# Patient Record
Sex: Female | Born: 1990
Health system: Southern US, Community
[De-identification: ages and names within clinical notes are randomized; demographics above are authoritative.]

## PROBLEM LIST (undated history)

## (undated) DIAGNOSIS — G43909 Migraine, unspecified, not intractable, without status migrainosus: Secondary | ICD-10-CM

---
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
## 2010-03-18 ENCOUNTER — Encounter
Admission: RE | Admit: 2010-03-18 | Discharge: 2010-03-18 | Payer: Self-pay | Source: Home / Self Care | Attending: Orthopedic Surgery | Admitting: Orthopedic Surgery

## 2012-08-23 ENCOUNTER — Ambulatory Visit (INDEPENDENT_AMBULATORY_CARE_PROVIDER_SITE_OTHER): Payer: BC Managed Care – PPO | Admitting: Family Medicine

## 2012-08-23 ENCOUNTER — Ambulatory Visit: Payer: BC Managed Care – PPO

## 2012-08-23 VITALS — BP 142/72 | HR 88 | Temp 98.0°F | Resp 16 | Ht 67.0 in | Wt 243.0 lb

## 2012-08-23 DIAGNOSIS — R079 Chest pain, unspecified: Secondary | ICD-10-CM

## 2012-08-23 DIAGNOSIS — R209 Unspecified disturbances of skin sensation: Secondary | ICD-10-CM

## 2012-08-23 DIAGNOSIS — IMO0001 Reserved for inherently not codable concepts without codable children: Secondary | ICD-10-CM

## 2012-08-23 DIAGNOSIS — R000 Tachycardia, unspecified: Secondary | ICD-10-CM

## 2012-08-23 DIAGNOSIS — R202 Paresthesia of skin: Secondary | ICD-10-CM

## 2012-08-23 DIAGNOSIS — R030 Elevated blood-pressure reading, without diagnosis of hypertension: Secondary | ICD-10-CM

## 2012-08-23 LAB — POCT CBC
Granulocyte percent: 77.3 %G (ref 37–80)
Hemoglobin: 14 g/dL (ref 12.2–16.2)
Lymph, poc: 2 (ref 0.6–3.4)
MCH, POC: 30.2 pg (ref 27–31.2)
MCHC: 31.9 g/dL (ref 31.8–35.4)
MCV: 94.9 fL (ref 80–97)
MID (cbc): 0.5 (ref 0–0.9)
MPV: 10.2 fL (ref 0–99.8)
POC Granulocyte: 8.4 — AB (ref 2–6.9)
POC LYMPH PERCENT: 18.4 %L (ref 10–50)
POC MID %: 4.3 %M (ref 0–12)
Platelet Count, POC: 313 10*3/uL (ref 142–424)
RBC: 4.63 M/uL (ref 4.04–5.48)
WBC: 10.9 10*3/uL — AB (ref 4.6–10.2)

## 2012-08-23 LAB — COMPREHENSIVE METABOLIC PANEL
Albumin: 4.3 g/dL (ref 3.5–5.2)
BUN: 16 mg/dL (ref 6–23)
CO2: 27 mEq/L (ref 19–32)
Chloride: 104 mEq/L (ref 96–112)
Creat: 0.85 mg/dL (ref 0.50–1.10)
Glucose, Bld: 108 mg/dL — ABNORMAL HIGH (ref 70–99)
Potassium: 4.2 mEq/L (ref 3.5–5.3)
Sodium: 140 mEq/L (ref 135–145)
Total Bilirubin: 0.5 mg/dL (ref 0.3–1.2)
Total Protein: 7 g/dL (ref 6.0–8.3)

## 2012-08-23 NOTE — Patient Instructions (Addendum)
If experiencing shoulder pain, take Tylenol. If Tylenol is not effective take the Voltaren with food to reduce the chance of nausea.  If your symptoms return or worsen - go the ER.

## 2012-08-23 NOTE — Progress Notes (Signed)
  Subjective:    Patient ID: Heather Duke, female    DOB: 14-Aug-1990, 22 y.o.   MRN: [id number]  HPI  Presents with paraesthesia x 1 hour following a "popping" sensation in her chest. Popping sensation is described as non painful in the sternal area which led to numbness/tingling across entire body. Pt reports when the numbness/tingling moved to her throat she decided to come to [facility]. Paraesthesia is bilateral arms, LEFT arm worse, and left face. Reports decreased strength in LEFT arm. Pt fell on Sunday and injured RIGHT rotator cuff. Pt began taking two new meds: tramodol and voltaren - she wonders if these could cause her symptoms. Reports these are causing nausea prior to today.     Review of Systems Denies: SOB, chest pain, abdominal pain, urinary symptoms.     Objective:   Physical Exam  Repeat vitals show pulse 88, BP 142/74   General: WDWN female, appears stated age, appears tearful and mildly nervous  Resp: clear to auscultation anterior and posterior fields, no rales/rhonchi/wheezes Cardiac: RRR, no murmurs/rubs/gallops Neuro: alert & oriented x 3, cranial nerves II-XII intact, cerebellar intact, decreased sensation in left arm from hand to elbow, decrease left grip strength, reflexes are 2+ patellar achilles biceps and brachioradialis bilaterally  Skin: no rashes, no lesions.   Results for orders placed in visit on 08/23/12  POCT CBC      Result Value Range   WBC 10.9 (*) 4.6 - 10.2 K/uL   Lymph, poc 2.0  0.6 - 3.4   POC LYMPH PERCENT 18.4  10 - 50 %L   MID (cbc) 0.5  0 - 0.9   POC MID % 4.3  0 - 12 %M   POC Granulocyte 8.4 (*) 2 - 6.9   Granulocyte percent 77.3  37 - 80 %G   RBC 4.63  4.04 - 5.48 M/uL   Hemoglobin 14.0  12.2 - 16.2 g/dL   HCT, POC 40.9  81.1 - 47.9 %   MCV 94.9  80 - 97 fL   MCH, POC 30.2  27 - 31.2 pg   MCHC 31.9  31.8 - 35.4 g/dL   RDW, POC 91.4     Platelet Count, POC 313  142 - 424 K/uL   MPV 10.2  0 - 99.8 fL  GLUCOSE, POCT (MANUAL  RESULT ENTRY)      Result Value Range   POC Glucose 108 (*) 70 - 99 mg/dl    Chest Xray read by Dr. Alwyn Ren: PA view shows some cardiomegaly however patient is twisted on xray providing illusion of cardiomegaly. No infiltrates.  No pneumothorax.     Assessment & Plan:   Chest pain - Plan: EKG 12-Lead, Comprehensive metabolic panel, DG Chest 2 View  Paresthesias - Plan: POCT CBC, POCT glucose (manual entry)  Elevated BP - Plan: TSH  Tachycardia  Repeat vitals showed significant improvement. Suspect anxiety as etiology for paraesthesia. Less likely to be side effect of medication, but since medication is the likely cause of nausea will encourage pt to quit taking medication. If she experiences pain she should take one medication or the other (instead of both) and take medication with food.

## 2012-08-23 NOTE — Progress Notes (Signed)
I have examined this patient along with the student and agree.  Home BP 110's systolic.  Typically 140's in medical offices. Never as high as it was initially today, which also worried her.  Reassured with improved pressure on repeat.  Etiology of symptoms is unclear.  There does not appear to be a cardiac cause.  Cerebrovascular event very unlikely. She may have experienced some reflux, due to the NSAID, and then symptoms worsened with anxiety. Anticipatory guidance provided.  To go the the ED if symptoms worsen/persist.  Discussed with Dr. Alwyn Ren.

## 2012-08-24 NOTE — Progress Notes (Signed)
Discussed patient, symptoms, tests, and plans with PA student and with Theora Gianotti [facility] and agree with assessment and approach.

## 2012-10-25 ENCOUNTER — Ambulatory Visit: Payer: BC Managed Care – PPO | Admitting: Family Medicine

## 2013-01-18 ENCOUNTER — Other Ambulatory Visit: Payer: Self-pay

## 2016-08-02 ENCOUNTER — Encounter (HOSPITAL_COMMUNITY): Payer: Self-pay

## 2016-08-02 ENCOUNTER — Emergency Department (HOSPITAL_COMMUNITY): Payer: BLUE CROSS/BLUE SHIELD

## 2016-08-02 ENCOUNTER — Emergency Department (HOSPITAL_COMMUNITY)
Admission: EM | Admit: 2016-08-02 | Discharge: 2016-08-03 | Disposition: A | Discharge status: Home / Self Care | Payer: BLUE CROSS/BLUE SHIELD | Attending: Emergency Medicine | Admitting: Emergency Medicine

## 2016-08-02 DIAGNOSIS — R200 Anesthesia of skin: Secondary | ICD-10-CM | POA: Diagnosis not present

## 2016-08-02 DIAGNOSIS — Z79899 Other long term (current) drug therapy: Secondary | ICD-10-CM | POA: Insufficient documentation

## 2016-08-02 DIAGNOSIS — G43109 Migraine with aura, not intractable, without status migrainosus: Secondary | ICD-10-CM | POA: Insufficient documentation

## 2016-08-02 DIAGNOSIS — Z5181 Encounter for therapeutic drug level monitoring: Secondary | ICD-10-CM | POA: Insufficient documentation

## 2016-08-02 DIAGNOSIS — R202 Paresthesia of skin: Secondary | ICD-10-CM | POA: Diagnosis not present

## 2016-08-02 LAB — COMPREHENSIVE METABOLIC PANEL
ALBUMIN: 3.7 g/dL (ref 3.5–5.0)
ALT: 22 U/L (ref 14–54)
AST: 20 U/L (ref 15–41)
Alkaline Phosphatase: 78 U/L (ref 38–126)
Anion gap: 9 (ref 5–15)
BILIRUBIN TOTAL: 0.3 mg/dL (ref 0.3–1.2)
BUN: 10 mg/dL (ref 6–20)
CO2: 22 mmol/L (ref 22–32)
Calcium: 9.5 mg/dL (ref 8.9–10.3)
Chloride: 107 mmol/L (ref 101–111)
Creatinine, Ser: 0.9 mg/dL (ref 0.44–1.00)
GFR calc Af Amer: >60 mL/min (ref >60)
GFR calc non Af Amer: >60 mL/min (ref >60)
Glucose, Bld: 120 mg/dL — ABNORMAL HIGH (ref 65–99)
Potassium: 3.9 mmol/L (ref 3.5–5.1)
Sodium: 138 mmol/L (ref 135–145)
Total Protein: 7 g/dL (ref 6.5–8.1)

## 2016-08-02 LAB — I-STAT TROPONIN, ED: TROPONIN I, POC: 0 ng/mL (ref 0.00–0.08)

## 2016-08-02 LAB — I-STAT CHEM 8, ED
BUN: 11 mg/dL (ref 6–20)
Calcium, Ion: 1.19 mmol/L (ref 1.15–1.40)
Chloride: 107 mmol/L (ref 101–111)
Creatinine, Ser: 0.9 mg/dL (ref 0.44–1.00)
Glucose, Bld: 123 mg/dL — ABNORMAL HIGH (ref 65–99)
HCT: 43 % (ref 36.0–46.0)
Hemoglobin: 14.6 g/dL (ref 12.0–15.0)
Potassium: 4 mmol/L (ref 3.5–5.1)
Sodium: 140 mmol/L (ref 135–145)
TCO2: 22 mmol/L (ref 0–100)

## 2016-08-02 LAB — CBC
HEMATOCRIT: 41.7 % (ref 36.0–46.0)
Hemoglobin: 13.7 g/dL (ref 12.0–15.0)
MCH: 28.4 pg (ref 26.0–34.0)
MCHC: 32.9 g/dL (ref 30.0–36.0)
MCV: 86.3 fL (ref 78.0–100.0)
Platelets: 375 10*3/uL (ref 150–400)
RBC: 4.83 MIL/uL (ref 3.87–5.11)
RDW: 13.9 % (ref 11.5–15.5)
WBC: 12.8 10*3/uL — ABNORMAL HIGH (ref 4.0–10.5)

## 2016-08-02 LAB — DIFFERENTIAL
Basophils Absolute: 0 10*3/uL (ref 0.0–0.1)
Basophils Relative: 0 %
Eosinophils Absolute: 0.1 10*3/uL (ref 0.0–0.7)
Eosinophils Relative: 1 %
Lymphocytes Relative: 19 %
Lymphs Abs: 2.4 10*3/uL (ref 0.7–4.0)
MONOS PCT: 4 %
Monocytes Absolute: 0.5 10*3/uL (ref 0.1–1.0)
Neutro Abs: 9.8 10*3/uL — ABNORMAL HIGH (ref 1.7–7.7)
Neutrophils Relative %: 76 %

## 2016-08-02 LAB — PROTIME-INR
INR: 0.96
Prothrombin Time: 12.8 seconds (ref 11.4–15.2)

## 2016-08-02 LAB — APTT: aPTT: 29 seconds (ref 24–36)

## 2016-08-02 MED ORDER — DEXAMETHASONE SODIUM PHOSPHATE 10 MG/ML IJ SOLN
10.0000 mg | Freq: Once | INTRAMUSCULAR | Status: AC
  Administered 2016-08-02: 10 mg via INTRAVENOUS
  Filled 2016-08-02: qty 1

## 2016-08-02 MED ORDER — SODIUM CHLORIDE 0.9 % IV BOLUS (SEPSIS)
1000.0000 mL | Freq: Once | INTRAVENOUS | Status: AC
  Administered 2016-08-02: 1000 mL via INTRAVENOUS

## 2016-08-02 MED ORDER — PROCHLORPERAZINE EDISYLATE 5 MG/ML IJ SOLN
10.0000 mg | Freq: Once | INTRAMUSCULAR | Status: AC
  Administered 2016-08-02: 10 mg via INTRAVENOUS
  Filled 2016-08-02: qty 2

## 2016-08-02 MED ORDER — DIPHENHYDRAMINE HCL 50 MG/ML IJ SOLN
25.0000 mg | Freq: Once | INTRAMUSCULAR | Status: AC
  Administered 2016-08-02: 25 mg via INTRAVENOUS
  Filled 2016-08-02: qty 1

## 2016-08-02 NOTE — ED Notes (Signed)
Patient stated that headache was only there momentarily.  No headache since arriving to the hospital.  No medicine taken by patient prior to coming to the hospital.

## 2016-08-02 NOTE — ED Notes (Signed)
Patient states that the pain and numb feeling comes and goes.  Not having any new pain or numbness at this time.  MD aware to come and revaluate.

## 2016-08-02 NOTE — ED Triage Notes (Signed)
Patient here for numbness on the left side with a headache and left sided facial droop.  Symptoms began at 1400, ED physican activated code stroke.  Neurologist at bedside in CT for exam. No indications for TPA per Neurologist at this time.  Patient is A&Ox4

## 2016-08-02 NOTE — ED Notes (Signed)
Patient ambulated to the restroom. No distress noted at this time. Patient and family aware still waiting re evaluation.

## 2016-08-02 NOTE — Consult Note (Signed)
Neurology Consultation Reason for Consult: Facial numbness Referring Physician: Tegeler, C  CC: Facial numbness  History is obtained from: Patient  HPI: Heather Duke is a 26 y.o. female with a history of headaches described as unilateral, retro-orbital, throbbing associated with photophobia who presents with left-sided facial numbness and tingling.  She states that initially her entire face felt numb, but then the tingling moved to her left side of her face and down into her arm. There has been persistent and therefore she sought care in the emergency department. Her forehead, but mostly the lower face and upper arm.   LKW: 2 PM tpa given?: no, mild symptoms  ROS: A 14 point ROS was performed and is negative except as noted in the HPI.   Past medical history: Headaches(description consistent with migraines)   Family History  Problem Relation Age of Onset  . Heart disease Paternal Grandfather      Social History:  reports that she has never smoked. She has never used smokeless tobacco. She reports that she drinks alcohol. She reports that she does not use drugs.   Exam: Current vital signs: BP (!) 142/90 (BP Location: Left Arm)   Pulse (!) 114   Temp 98.2 F (36.8 C) (Oral)   Resp 18   Ht 5\' 7"  (1.702 m)   Wt 127.3 kg (280 lb 9 oz)   SpO2 99%   BMI 43.94 kg/m  Vital signs in last 24 hours: Temp:  [98.2 F (36.8 C)-98.7 F (37.1 C)] 98.2 F (36.8 C) (05/21 1710) Pulse Rate:  [114-126] 114 (05/21 1710) Resp:  [18-20] 18 (05/21 1710) BP: (142-160)/(90-100) 142/90 (05/21 1710) SpO2:  [99 %] 99 % (05/21 1710) Weight:  [127.3 kg (280 lb 9 oz)] 127.3 kg (280 lb 9 oz) (05/21 1711)   Physical Exam  Constitutional: Appears well-developed and well-nourished.  Psych: Affect appropriate to situation Eyes: No scleral injection HENT: No OP obstrucion Head: Normocephalic.  Cardiovascular: Normal rate and regular rhythm.  Respiratory: Effort normal and breath sounds  normal to anterior ascultation GI: Soft.  No distension. There is no tenderness.  Skin: WDI  Neuro: Mental Status: Patient is awake, alert, oriented to person, place, month, year, and situation. Patient is able to give a clear and coherent history. No signs of aphasia or neglect Cranial Nerves: II: Visual Fields are full. Pupils are equal, round, and reactive to light.   III,IV, VI: EOMI without ptosis or diploplia.  V: Facial sensation is Decreased in the left lower face VII: Facial movement is notable for decreased movement of the left lips, when asked to puff out her cheeks, she holds her left face taut  VIII: hearing is intact to voice X: Uvula elevates symmetrically XI: Shoulder shrug is symmetric. XII: tongue is midline without atrophy or fasciculations.  Motor: Tone is normal. Bulk is normal. 5/5 strength was present in all four extremities.  Sensory: Sensation is symmetric to light touch and temperature in the arms and legs. Deep Tendon Reflexes: 2+ and symmetric in the biceps and patellae.  Cerebellar: FNF and HKS are intact bilaterally   I have reviewed labs in epic and the results pertinent to this consultation are: Chem 8-unremarkable  I have reviewed the images obtained: CT head-unremarkable  Impression: 27 year old female with spreading paresthesia in the setting of frequent migraines. Her facial droop appears to have some component of embellishment. I do wonder about possible persistent migraine aura given the frequent migraine like headaches that she describes over the past  few days.  I would favor treating and if her symptoms improve, then no further evaluation is needed. She does continue to have symptoms, then an MRI would need be needed to exclude organic causes.  Recommendations: 1) migraine cocktail 2) if no improvement with cocktail, would perform MRI and if negative, no further workup would be needed.   Ritta SlotMcNeill , MD [facility] [phone]  If 7pm- 7am, please page neurology on call as listed in AMION.

## 2016-08-02 NOTE — ED Notes (Addendum)
CT negative for acute stroke, with aspects of 10. Plan of care is to give migraine meds and revaluate.  If no improvement then MRI.  Plan of care told to family by ED MD.

## 2016-08-02 NOTE — ED Provider Notes (Signed)
[facility]-EMERGENCY DEPT Provider Note   CSN: [id number] Arrival date & time: 08/02/16  1642     History   Chief Complaint Chief Complaint  Patient presents with  . Numbness    HPI Heather Duke is a 26 y.o. female.  The history is provided by the patient, a parent and medical records. No language interpreter was used.  Neurologic Problem  This is a new problem. The current episode started 3 to 5 hours ago. The problem occurs constantly. The problem has not changed since onset.Associated symptoms include headaches (recent). Pertinent negatives include no chest pain, no abdominal pain and no shortness of breath. Nothing aggravates the symptoms. Nothing relieves the symptoms. She has tried nothing for the symptoms. The treatment provided no relief.    History reviewed. No pertinent past medical history.  There are no active problems to display for this patient.   History reviewed. No pertinent surgical history.  OB History    No data available       Home Medications    Prior to Admission medications   Medication Sig Start Date End Date Taking? Authorizing Provider  diclofenac (VOLTAREN) 75 MG EC tablet Take 75 mg by mouth 2 (two) times daily.    [provider]  traMADol (ULTRAM) 50 MG tablet Take 50 mg by mouth every 6 (six) hours as needed for pain.    [provider]    Family History Family History  Problem Relation Age of Onset  . Heart disease Paternal Grandfather     Social History Social History  Substance Use Topics  . Smoking status: Never Smoker  . Smokeless tobacco: Never Used  . Alcohol use Yes     Allergies   Singulair [montelukast sodium]   Review of Systems Review of Systems  Constitutional: Negative for activity change, appetite change, chills, diaphoresis, fatigue and fever.  HENT: Negative for congestion, facial swelling and rhinorrhea.   Eyes: Negative for visual disturbance.  Respiratory: Negative for cough,  chest tightness, shortness of breath and stridor.   Cardiovascular: Negative for chest pain, palpitations and leg swelling.  Gastrointestinal: Negative for abdominal distention, abdominal pain, constipation, diarrhea, nausea and vomiting.  Genitourinary: Negative for difficulty urinating, dysuria, flank pain, frequency, hematuria, menstrual problem, pelvic pain, vaginal bleeding and vaginal discharge.  Musculoskeletal: Negative for back pain and neck pain.  Skin: Negative for rash and wound.  Neurological: Positive for numbness and headaches (recent). Negative for dizziness, tremors, seizures, facial asymmetry, speech difficulty, weakness and light-headedness.  Psychiatric/Behavioral: Negative for agitation and confusion.  All other systems reviewed and are negative.    Physical Exam Updated Vital Signs BP (!) 142/90 (BP Location: Left Arm)   Pulse (!) 114   Temp 98.2 F (36.8 C) (Oral)   Resp 18   Ht 5\' 7"  (1.702 m)   Wt 127.3 kg (280 lb 9 oz)   SpO2 99%   BMI 43.94 kg/m   Physical Exam  Constitutional: She is oriented to person, place, and time. She appears well-developed and well-nourished. No distress.  HENT:  Head: Normocephalic and atraumatic.  Mouth/Throat: Oropharynx is clear and moist. No oropharyngeal exudate.  Eyes: Conjunctivae and EOM are normal. Pupils are equal, round, and reactive to light.  Neck: Normal range of motion. Neck supple.  Cardiovascular: Normal rate, regular rhythm and intact distal pulses.  Exam reveals no gallop.   No murmur heard. Pulmonary/Chest: Effort normal and breath sounds normal. No stridor. No respiratory distress. She exhibits no tenderness.  Abdominal:  Soft. There is no tenderness.  Musculoskeletal: She exhibits no edema.  Neurological: She is alert and oriented to person, place, and time. She displays no tremor and normal reflexes. A sensory deficit is present. No cranial nerve deficit. She exhibits normal muscle tone. She displays no  seizure activity. Coordination normal. GCS eye subscore is 4. GCS verbal subscore is 5. GCS motor subscore is 6.  Left facial numbness and left arm numbness. Mild. No facial droop. Normal finger-nose-finger. Normal grip strength. Exam otherwise unremarkable.  Skin: Skin is warm and dry. Capillary refill takes less than 2 seconds. No rash noted. No erythema.  Psychiatric: She has a normal mood and affect.  Nursing note and vitals reviewed.    ED Treatments / Results  Labs (all labs ordered are listed, but only abnormal results are displayed) Labs Reviewed  CBC - Abnormal; Notable for the following:       Result Value   WBC 12.8 (*)    All other components within normal limits  DIFFERENTIAL - Abnormal; Notable for the following:    Neutro Abs 9.8 (*)    All other components within normal limits  COMPREHENSIVE METABOLIC PANEL - Abnormal; Notable for the following:    Glucose, Bld 120 (*)    All other components within normal limits  I-STAT CHEM 8, ED - Abnormal; Notable for the following:    Glucose, Bld 123 (*)    All other components within normal limits  PROTIME-INR  APTT  I-STAT TROPOININ, ED  CBG MONITORING, ED    EKG  EKG Interpretation  Date/Time:  Monday Aug 02 2016 16:51:53 EDT Ventricular Rate:  125 PR Interval:  112 QRS Duration: 82 QT Interval:  326 QTC Calculation: 470 R Axis:   82 Text Interpretation:  Sinus tachycardia Otherwise normal ECG No prior ECG for comparison.  No STEMI Confirmed by Theda Belfast ([id number]) on 08/02/2016 8:25:39 PM       Radiology Ct Head Code Stroke W/o Cm  Result Date: 08/02/2016 CLINICAL DATA:  Code stroke. LEFT-sided weakness. Lack of coordination. EXAM: CT HEAD WITHOUT CONTRAST TECHNIQUE: Contiguous axial images were obtained from the base of the skull through the vertex without intravenous contrast. COMPARISON:  None. FINDINGS: Brain: No evidence for acute infarction, hemorrhage, mass lesion, hydrocephalus, or extra-axial fluid.  Normal cerebral volume. No white matter disease. Vascular: No hyperdense vessel or unexpected calcification. Skull: Normal. Negative for fracture or focal lesion. Sinuses/Orbits: No acute finding. Other: None. ASPECTS [facility] Stroke Program Early CT Score) - Ganglionic level infarction (caudate, lentiform nuclei, internal capsule, insula, M1-M3 cortex): 7 - Supraganglionic infarction (M4-M6 cortex): 3 Total score (0-10 with 10 being normal): 10 IMPRESSION: 1. Negative exam 2. ASPECTS is 10 These results were called by telephone at the time of interpretation on 08/02/2016 at 5:49 pm to Dr. Amada Jupiter , who verbally acknowledged these results. Electronically Signed   By: Elsie Stain M.D.   On: 08/02/2016 17:49    Procedures Procedures (including critical care time)  Medications Ordered in ED Medications  sodium chloride 0.9 % bolus 1,000 mL (0 mLs Intravenous Stopped 08/02/16 1921)  prochlorperazine (COMPAZINE) injection 10 mg (10 mg Intravenous Given 08/02/16 1814)  dexamethasone (DECADRON) injection 10 mg (10 mg Intravenous Given 08/02/16 1826)  diphenhydrAMINE (BENADRYL) injection 25 mg (25 mg Intravenous Given 08/02/16 1815)     Initial Impression / Assessment and Plan / ED Course  I have reviewed the triage vital signs and the nursing notes.  Pertinent labs & imaging results that  were available during my care of the patient were reviewed by me and considered in my medical decision making (see chart for details).     VERNON ARIEL is a 26 y.o. female with a past medical history of headaches who presents with left-sided face and left arm numbness. Patient was made a code stroke in route for her symptoms. Patient's last normal was approximately 3 hours prior to arrival. Patient denied any weakness or facial droop. No difficulty speaking. No other complaints. Neurology saw the patient shortly after arrival and after CT scan.  Patient was examined by me. Patient had clear lungs. Nontender  chest. Nontender abdomen. No focal weakness or facial droop seen. Tingling sensation in left face and left arm. Normal pulses. No rashes. No tick exposures.  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 Neurology recommended administration of headache cocktail and reassessment. If patient had no improvement in symptoms, would consider MRI however is improved, likely migrainous cause.  Patient was given migraine cocktail of fluids, Decadron, Compazine, and Benadryl. Patient reported near complete resolution in her symptoms after administration of medications and observation.  Patient continued to have resolution of symptoms on my reassessment. Patient continued to have no weakness or facial droop.  Given improvement in symptoms, suspect the non-headache aura from atypical migraine. Patient given prescription for Compazine and instructions to follow-up with neurology. Do not feel patient needs MRI and family and patient all agree. Patient understood return precautions for any new or worsened symptoms. Patient had no depressions or concerns and patient was discharged in good condition.   Final Clinical Impressions(s) / ED Diagnoses   Final diagnoses:  Migraine aura without headache  Numbness  Paresthesias    New Prescriptions Discharge Medication List as of 08/03/2016 12:12 AM    START taking these medications   Details  prochlorperazine (COMPAZINE) 10 MG tablet Take 1 tablet (10 mg total) by mouth 2 (two) times daily as needed for nausea or vomiting (and headache)., Starting Tue 08/03/2016, Print        Clinical Impression: 1. Migraine aura without headache   2. Numbness   3. Paresthesias     Disposition: Discharge  Condition: Good  I have discussed the results, Dx and Tx plan with the pt(& family if present). He/she/they expressed understanding and agree(s) with the plan. Discharge instructions  discussed at great length. Strict return precautions discussed and pt &/or family have verbalized understanding of the instructions. No further questions at time of discharge.    Discharge Medication List as of 08/03/2016 12:12 AM    START taking these medications   Details  prochlorperazine (COMPAZINE) 10 MG tablet Take 1 tablet (10 mg total) by mouth 2 (two) times daily as needed for nausea or vomiting (and headache)., Starting Tue 08/03/2016, Print        Follow Up: [facility] [address]     [address] [place] [address] [phone] Schedule an appointment as soon as possible for a visit    [facility] EMERGENCY DEPARTMENT [address] [phone] mc [place] [address] [phone]  If symptoms worsen     Tegeler, Canary Brim, MD 08/03/16 0201

## 2016-08-02 NOTE — ED Notes (Signed)
ED physician at bedside.

## 2016-08-02 NOTE — ED Notes (Signed)
Pt ambulated to restroom, tolerated well.

## 2016-08-03 MED ORDER — PROCHLORPERAZINE MALEATE 10 MG PO TABS: 10.0000 mg | ORAL_TABLET | Freq: Two times a day (BID) | ORAL | 0 refills | Status: AC | PRN

## 2016-08-03 NOTE — Discharge Instructions (Signed)
Your workup today did not show acute stroke. 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 You responded well to the medications. Please follow-up with the neurology team. If any symptoms change or worsen, please return to the nearest emergency department.

## 2016-08-03 NOTE — ED Notes (Signed)
Patient Alert and oriented X4. Stable and ambulatory. Patient verbalized understanding of the discharge instructions.  Patient belongings were taken by the patient.  

## 2016-08-30 ENCOUNTER — Ambulatory Visit (INDEPENDENT_AMBULATORY_CARE_PROVIDER_SITE_OTHER): Payer: BLUE CROSS/BLUE SHIELD | Admitting: Neurology

## 2016-08-30 ENCOUNTER — Encounter: Payer: Self-pay | Admitting: Neurology

## 2016-08-30 VITALS — BP 158/94 | HR 117 | Ht 66.0 in | Wt 282.0 lb

## 2016-08-30 DIAGNOSIS — G43109 Migraine with aura, not intractable, without status migrainosus: Secondary | ICD-10-CM | POA: Diagnosis not present

## 2016-08-30 DIAGNOSIS — F515 Nightmare disorder: Secondary | ICD-10-CM

## 2016-08-30 DIAGNOSIS — E669 Obesity, unspecified: Secondary | ICD-10-CM | POA: Diagnosis not present

## 2016-08-30 DIAGNOSIS — R0683 Snoring: Secondary | ICD-10-CM | POA: Diagnosis not present

## 2016-08-30 DIAGNOSIS — R51 Headache: Secondary | ICD-10-CM | POA: Diagnosis not present

## 2016-08-30 DIAGNOSIS — R519 Headache, unspecified: Secondary | ICD-10-CM

## 2016-08-30 NOTE — Patient Instructions (Signed)
Please remember, common headache triggers are: sleep deprivation, dehydration, overheating, stress, hypoglycemia or skipping meals and blood sugar fluctuations, excessive pain medications or excessive alcohol use or caffeine withdrawal. Some people have food triggers such as aged cheese, orange juice or chocolate, especially dark chocolate, or MSG (monosodium glutamate). Try to avoid these headache triggers as much possible. It may be helpful to keep a headache diary to figure out what makes your headaches worse or brings them on and what alleviates them. Some people report headache onset after exercise but studies have shown that regular exercise may actually prevent headaches from coming. If you have exercise-induced headaches, please make sure that you drink plenty of fluid before and after exercising and that you do not over do it and do not overheat.  We will do a brain scan, called MRI and call you with the test results. We will have to schedule you for this on a separate date. This test requires authorization from your insurance, and we will take care of the insurance process.  Based on your symptoms and your exam I believe you are at risk for obstructive sleep apnea or OSA, and I think we should proceed with a sleep study to determine whether you do or do not have OSA and how severe it is. If you have more than mild OSA, I want you to consider treatment with CPAP. Please remember, the risks and ramifications of moderate to severe obstructive sleep apnea or OSA are: Cardiovascular disease, including congestive heart failure, stroke, difficult to control hypertension, arrhythmias, and even type 2 diabetes has been linked to untreated OSA. Sleep apnea causes disruption of sleep and sleep deprivation in most cases, which, in turn, can cause recurrent headaches, problems with memory, mood, concentration, focus, and vigilance. Most people with untreated sleep apnea report excessive daytime sleepiness, which  can affect their ability to drive. Please do not drive if you feel sleepy.   I will likely see you back after your sleep study to go over the test results and where to go from there. We will call you after your sleep study to advise about the results (most likely, you will hear from Lafonda Mossesiana, my nurse) and to set up an appointment at the time, as necessary.    Our sleep lab administrative assistant, Alvis LemmingsDawn will meet with you or call you to schedule your sleep study. If you don't hear back from her by next week please feel free to call her at [phone]. This is her direct line and please leave a message with your phone number to call back if you get the voicemail box. She will call back as soon as possible.

## 2016-08-30 NOTE — Progress Notes (Signed)
Subjective:    Patient ID: Heather Duke is a 26 y.o. female.  HPI     Huston Foley, MD, PhD [facility] [address] [address] [place], [place] [address]  I saw Heather Duke as a referral from the emergency room for evaluation of migraine. The patient is unaccompanied today. She is a 26 year old right-handed woman with a past medical history of headaches, Anxiety, depression, sleep disturbance, and obesity, who presented to the emergency room on 08/02/2016 with left-sided face and arm numbness which lasted for about 3-5 hours. She had no associated weakness. She had no droopy face or slurring of speech. She had a head CT without contrast in the emergency room which was negative. Symptoms of numbness started in her face and then seemed to travel to the left arm. Was felt to have possible migraine aura without associated headache with a history of recurrent migrainous headaches in the past. She received symptomatic treatment with a headache cocktail and improved. She was seen in the emergency room by Dr. Amada Jupiter, neurology hospitalist. She attributed her numbness to anxiety, as she has had one-sided numbness before, typically it lasted less than 2 hours. She has had migrainous headaches infrequently as well, describing of one-sided headache, throbbing, behind the eyes at times, associated with photophobia but typically no nausea or vomiting. She has no family history of migraines. She has no family history of OSA. She has been told by her fianc that she snores. She does not typically have morning headaches, denies nocturia. She has no visual aura with her migrainous headaches which occur approximately once every other month or so. She tries to get enough sleep. Bedtime is around 9:30 and wake up time is around 6:30. She has reduced her soda intake since her ER visit. She tries to hydrate with water. She does drink coffee about 20 ounces per day. She is a  nonsmoker and drinks alcohol about twice a week. She lives alone and is engaged to be married. She has no children. She works for [employer]. She has been placed on Lunesta about a week ago by her behavioral health provider and she has been on prazosin for the past 2 years for nightmares. Currently she feels at baseline. She feels that her numbness improved after her headache cocktail. She was also given a prescription for Compazine. She reports a family history of brain aneurysm in [place], stroke in her maternal grandfather.  Her Past Medical History Is Significant For: No past medical history on file.  Her Past Surgical History Is Significant For: No past surgical history on file.  Her Family History Is Significant For: Family History  Problem Relation Age of Onset  . Heart disease Paternal Grandfather     Her Social History Is Significant For: Social History   Social History  . Marital status: Single    Spouse name: N/A  . Number of children: N/A  . Years of education: N/A   Social History Main Topics  . Smoking status: Never Smoker  . Smokeless tobacco: Never Used  . Alcohol use Yes  . Drug use: No  . Sexual activity: Yes   Other Topics Concern  . None   Social History Narrative  . None    Her Allergies Are:  Allergies  Allergen Reactions  . Singulair [Montelukast Sodium] Anxiety  :   Her Current Medications Are:  Outpatient Encounter Prescriptions as of 08/30/2016  Medication Sig  . buPROPion (WELLBUTRIN XL) 300 MG 24  hr tablet Take 300 mg by mouth every morning.  . eszopiclone (LUNESTA) 2 MG TABS tablet Take 2 mg by mouth at bedtime as needed for sleep. Take immediately before bedtime  . prazosin (MINIPRESS) 2 MG capsule Take 6 mg by mouth at bedtime.  . prochlorperazine (COMPAZINE) 10 MG tablet Take 1 tablet (10 mg total) by mouth 2 (two) times daily as needed for nausea or vomiting (and headache).   No facility-administered encounter medications on file as  of 08/30/2016.   :  Review of Systems:  Out of a complete 14 point review of systems, all are reviewed and negative with the exception of these symptoms as listed below: Review of Systems  Neurological:       Pt presents as a new patient today with numbness that she presented to the ED with in May. Pt stated the numbness was in the left side of face and body. Pt states that the numbness has only happened that one time that she seeked medical attention in the ED. Pt verbalizes having frequent headaches estimating about 2 a wk.     Objective:  Neurologic Exam  Physical Exam Physical Examination:   Vitals:   08/30/16 1414  BP: (!) 158/94  Pulse: (!) 117    General Examination: The patient is a very pleasant 26 y.o. female in no acute distress. She appears well-developed and well-nourished and well groomed.   HEENT: Normocephalic, atraumatic, pupils are equal, round and reactive to light and accommodation. Funduscopic exam is normal with sharp disc margins noted. Extraocular tracking is good without limitation to gaze excursion or nystagmus noted. Normal smooth pursuit is noted. Hearing is grossly intact. Tympanic membranes are clear bilaterally. Face is symmetric with normal facial animation and normal facial sensation. Speech is clear with no dysarthria noted. There is no hypophonia. There is no lip, neck/head, jaw or voice tremor. Neck is supple with full range of passive and active motion. There are no carotid bruits on auscultation. Oropharynx exam reveals: mild mouth dryness, good dental hygiene and mild to moderate airway crowding secondary to tonsillar size of 1-2+, larger uvula. Neck circumference is 16-3/4 inches. She has a nearly absent overbite.   Chest: Clear to auscultation without wheezing, rhonchi or crackles noted.  Heart: S1+S2+0, regular and normal without murmurs, rubs or gallops noted.   Abdomen: Soft, non-tender and non-distended with normal bowel sounds appreciated on  auscultation.  Extremities: There is no pitting edema in the distal lower extremities bilaterally. Pedal pulses are intact.  Skin: Warm and dry without trophic changes noted. There are no varicose veins.  Musculoskeletal: exam reveals no obvious joint deformities, tenderness or joint swelling or erythema.   Neurologically:  Mental status: The patient is awake, alert and oriented in all 4 spheres. Her immediate and remote memory, attention, language skills and fund of knowledge are appropriate. There is no evidence of aphasia, agnosia, apraxia or anomia. Speech is clear with normal prosody and enunciation. Thought process is linear. Mood is normal and affect is normal.  Cranial nerves II - XII are as described above under HEENT exam. In addition: shoulder shrug is normal with equal shoulder height noted. Motor exam: Normal bulk, strength and tone is noted. There is no drift, tremor or rebound. Romberg is negative. Reflexes are 2+ throughout. Babinski: Toes are flexor bilaterally. Fine motor skills and coordination: intact with normal finger taps, normal hand movements, normal rapid alternating patting, normal foot taps and normal foot agility.  Cerebellar testing: No dysmetria or  intention tremor on finger to nose testing. Heel to shin is unremarkable bilaterally. There is no truncal or gait ataxia.  Sensory exam: intact to light touch, pinprick, vibration, temperature sense in the upper and lower extremities.  Gait, station and balance: She stands easily. No veering to one side is noted. No leaning to one side is noted. Posture is age-appropriate and stance is narrow based. Gait shows normal stride length and normal pace. No problems turning are noted. Tandem walk is unremarkable.  Assessment and Plan:  In summary, KIMBERLI WINNE is a very pleasant 26 y.o.-year old female with a past medical history of headaches, Anxiety, depression, sleep disturbance, and obesity, who presented to the emergency  room on 08/02/2016 with left-sided face and arm numbness which lasted for about 3-5 hours. She was treated symptomatically with a migraine cocktail and improved, head CT without contrast was negative for any acute changes. She currently feels at baseline. She does give a history of infrequent recurrent migrainous headaches and may have had a migraine aura before. Her history and physical exam are also concerning for underlying obstructive sleep apnea. I suggested we proceed with a brain MRI with and without contrast to rule out a structural lesion, in particular what with her family history of brain aneurysm. In addition, I would suggest she return for a sleep study. I did not suggest any new medications at this time as her symptoms are few and far between and exam looks good today and she feels at baseline, has not had any headaches since her ER visit, tries to be more mindful of her water intake and has reduced her sodium intake. We talked about potential migraine and headache triggers.  I also explained the diagnosis of OSA, its prognosis and treatment options. We talked about medical treatments, surgical interventions and non-pharmacological approaches. I explained in particular the risks and ramifications of untreated moderate to severe OSA, especially with respect to developing cardiovascular disease down the Road, including congestive heart failure, difficult to treat hypertension, cardiac arrhythmias, or stroke. Even type 2 diabetes has, in part, been linked to untreated OSA. Symptoms of untreated OSA include daytime sleepiness, memory problems, mood irritability and mood disorder such as depression and anxiety, lack of energy, as well as recurrent headaches, especially morning headaches. We talked about trying to maintain a  healthy lifestyle in general, as well as the importance of weight control. I encouraged the patient to eat healthy, exercise daily and keep well hydrated, to keep a scheduled bedtime  and wake time routine, to not skip any meals and eat healthy snacks in between meals. I advised the patient not to drive when feeling sleepy.  I recommended the following at this time: sleep study with potential positive airway pressure titration. (We will score hypopneas at 3%) and MRI brain w/wo contrast.   I explained the CPAP treatment option to the patient.   I answered all her questions today and the patient was in agreement. I would like to see her back after the tests are completed or as needed, and we will also keep her posted as to his test results via phone call.    Huston Foley, MD, PhD

## 2016-10-05 ENCOUNTER — Telehealth: Payer: Self-pay | Admitting: Neurology

## 2016-10-05 NOTE — Telephone Encounter (Signed)
We have attempted to call the patient 3 times to schedule sleep study. Patient has been unavailable at the phone numbers we have on file and has not returned our calls. At this point we will send a letter asking pt to please contact the sleep lab to schedule their sleep study. If patient calls back we will schedule them for their sleep study. ° °

## 2016-10-17 ENCOUNTER — Other Ambulatory Visit: Payer: BLUE CROSS/BLUE SHIELD

## 2016-11-04 ENCOUNTER — Ambulatory Visit (INDEPENDENT_AMBULATORY_CARE_PROVIDER_SITE_OTHER): Payer: BLUE CROSS/BLUE SHIELD | Admitting: Neurology

## 2016-11-04 DIAGNOSIS — F515 Nightmare disorder: Secondary | ICD-10-CM

## 2016-11-04 DIAGNOSIS — E669 Obesity, unspecified: Secondary | ICD-10-CM

## 2016-11-04 DIAGNOSIS — R51 Headache: Secondary | ICD-10-CM

## 2016-11-04 DIAGNOSIS — R519 Headache, unspecified: Secondary | ICD-10-CM

## 2016-11-04 DIAGNOSIS — G43109 Migraine with aura, not intractable, without status migrainosus: Secondary | ICD-10-CM

## 2016-11-04 DIAGNOSIS — R0683 Snoring: Secondary | ICD-10-CM

## 2016-11-05 ENCOUNTER — Encounter (HOSPITAL_COMMUNITY): Payer: Self-pay | Admitting: Emergency Medicine

## 2016-11-05 DIAGNOSIS — G43709 Chronic migraine without aura, not intractable, without status migrainosus: Secondary | ICD-10-CM | POA: Insufficient documentation

## 2016-11-05 DIAGNOSIS — R002 Palpitations: Secondary | ICD-10-CM | POA: Diagnosis not present

## 2016-11-05 DIAGNOSIS — R51 Headache: Secondary | ICD-10-CM | POA: Diagnosis present

## 2016-11-05 NOTE — ED Triage Notes (Signed)
Reports having headache today since 1pm that has gotten worse.  Having them on and off since May.  Also reporting left side numbness.  Ambulatory in triage.

## 2016-11-06 ENCOUNTER — Emergency Department (HOSPITAL_COMMUNITY)
Admission: EM | Admit: 2016-11-06 | Discharge: 2016-11-06 | Disposition: A | Discharge status: Home / Self Care | Payer: BLUE CROSS/BLUE SHIELD | Attending: Physician Assistant | Admitting: Physician Assistant

## 2016-11-06 ENCOUNTER — Emergency Department (HOSPITAL_COMMUNITY): Payer: BLUE CROSS/BLUE SHIELD

## 2016-11-06 DIAGNOSIS — G43709 Chronic migraine without aura, not intractable, without status migrainosus: Secondary | ICD-10-CM

## 2016-11-06 HISTORY — DX: Migraine, unspecified, not intractable, without status migrainosus: G43.909

## 2016-11-06 MED ORDER — BUTALBITAL-APAP-CAFFEINE 50-325-40 MG PO TABS: 1.0000 | ORAL_TABLET | Freq: Four times a day (QID) | ORAL | 0 refills | Status: AC | PRN

## 2016-11-06 MED ORDER — KETOROLAC TROMETHAMINE 30 MG/ML IJ SOLN
30.0000 mg | Freq: Once | INTRAMUSCULAR | Status: AC
  Administered 2016-11-06: 30 mg via INTRAVENOUS
  Filled 2016-11-06: qty 1

## 2016-11-06 MED ORDER — DIPHENHYDRAMINE HCL 50 MG/ML IJ SOLN
25.0000 mg | Freq: Once | INTRAMUSCULAR | Status: AC
  Administered 2016-11-06: 25 mg via INTRAVENOUS
  Filled 2016-11-06: qty 1

## 2016-11-06 MED ORDER — SODIUM CHLORIDE 0.9 % IV BOLUS (SEPSIS)
1000.0000 mL | Freq: Once | INTRAVENOUS | Status: AC
  Administered 2016-11-06: 1000 mL via INTRAVENOUS

## 2016-11-06 MED ORDER — GADOBENATE DIMEGLUMINE 529 MG/ML IV SOLN
20.0000 mL | Freq: Once | INTRAVENOUS | Status: AC | PRN
  Administered 2016-11-06: 20 mL via INTRAVENOUS

## 2016-11-06 MED ORDER — PROCHLORPERAZINE EDISYLATE 5 MG/ML IJ SOLN
10.0000 mg | Freq: Once | INTRAMUSCULAR | Status: AC
  Administered 2016-11-06: 10 mg via INTRAVENOUS
  Filled 2016-11-06: qty 2

## 2016-11-06 NOTE — Discharge Instructions (Signed)
Your MRI MRA were reassuring, please follow up with neurology as an outpatient.

## 2016-11-06 NOTE — ED Provider Notes (Signed)
[facility]-EMERGENCY DEPT Provider Note   CSN: [id number] Arrival date & time: 11/05/16  2022     History   Chief Complaint Chief Complaint  Patient presents with  . Headache  . Palpitations    HPI MAYLIN FREEBURG is a 26 y.o. female.  HPI   26 year old presenting with headache. Patient has had on and off headaches for the last 2 weeks. Prior to that she had IV been diagnosed complex migraine in late May. Patient saw neurology. She was scheduled for MRI as an outpatient in 2 weeks. MRI was not initially scheduled emergently because patient had not been having any other headaches. Patient reports that she's been having intermittent slurred speech. She reports intermittent blurred vision. Currently patient has no symptoms except headache and left-sided tingling. This is consistent with prior complex migraine.  Patient's grandmother had subarachnoid hemorrhage at age 78.  Past Medical History:  Diagnosis Date  . Migraines     There are no active problems to display for this patient.   History reviewed. No pertinent surgical history.  OB History    No data available       Home Medications    Prior to Admission medications   Medication Sig Start Date End Date Taking? Authorizing Provider  prochlorperazine (COMPAZINE) 10 MG tablet Take 1 tablet (10 mg total) by mouth 2 (two) times daily as needed for nausea or vomiting (and headache). Patient not taking: Reported on 11/06/2016 08/03/16   Tegeler, Canary Brim, MD    Family History Family History  Problem Relation Age of Onset  . Heart disease Paternal Grandfather     Social History Social History  Substance Use Topics  . Smoking status: Never Smoker  . Smokeless tobacco: Never Used  . Alcohol use Yes     Allergies   Singulair [montelukast sodium]   Review of Systems Review of Systems  Constitutional: Negative for activity change.  Respiratory: Negative for shortness of breath.   Cardiovascular: Negative  for chest pain.  Gastrointestinal: Negative for abdominal pain.  Neurological: Positive for dizziness, weakness, numbness and headaches.  All other systems reviewed and are negative.    Physical Exam Updated Vital Signs BP 139/60   Pulse (!) 113   Temp 98.4 F (36.9 C)   Resp 17   Ht 5\' 6"  (1.676 m)   Wt 129.3 kg (285 lb)   LMP 10/21/2016   SpO2 100%   BMI 46.00 kg/m   Physical Exam  Constitutional: She is oriented to person, place, and time. She appears well-developed and well-nourished.  HENT:  Head: Normocephalic and atraumatic.  Eyes: Right eye exhibits no discharge.  Cardiovascular: Normal rate, regular rhythm and normal heart sounds.   No murmur heard. Pulmonary/Chest: Effort normal and breath sounds normal. She has no wheezes. She has no rales.  Abdominal: Soft. She exhibits no distension. There is no tenderness.  Neurological: She is oriented to person, place, and time. No cranial nerve deficit. Coordination normal.  Skin: Skin is warm and dry. She is not diaphoretic.  Psychiatric: She has a normal mood and affect.  Nursing note and vitals reviewed.    ED Treatments / Results  Labs (all labs ordered are listed, but only abnormal results are displayed) Labs Reviewed - No data to display  EKG  EKG Interpretation None       Radiology Mr Maxine Glenn Head Wo Contrast  Result Date: 11/06/2016 CLINICAL DATA:  26 y/o female with headache for the past 2 weeks. Intermittent slurred speech  in blurred vision. EXAM: MRI HEAD WITHOUT AND WITH CONTRAST MRA HEAD WITHOUT CONTRAST TECHNIQUE: Multiplanar, multiecho pulse sequences of the brain and surrounding structures were obtained without and with intravenous contrast. Angiographic images of the head were obtained using MRA technique without contrast. CONTRAST:  65mL MULTIHANCE GADOBENATE DIMEGLUMINE 529 MG/ML IV SOLN COMPARISON:  Head CT without contrast 08/02/2016. FINDINGS: MRI HEAD FINDINGS Brain: Cerebral volume is within  normal limits. No restricted diffusion to suggest acute infarction. No midline shift, mass effect, evidence of mass lesion, ventriculomegaly, extra-axial collection or acute intracranial hemorrhage. Cervicomedullary junction and pituitary are within normal limits. Upper limits of normal to mildly age advanced but small subcortical foci of white matter T2 and FLAIR hyperintensity in the superior frontal lobes, in a nonspecific configuration. Otherwise normal gray and white matter signal no cortical encephalomalacia. No chronic cerebral blood products. No abnormal enhancement identified. No dural thickening. Vascular: Major intracranial vascular flow voids are preserved, the distal left vertebral artery appears dominant. Major dural venous sinuses are enhancing and appear patent. Skull and upper cervical spine: Negative. Visualized bone marrow signal is within normal limits. Sinuses/Orbits: Normal orbits soft tissues. Visualized paranasal sinuses and mastoids are stable and well pneumatized. Other: Grossly normal visible internal auditory structures. Scalp and face soft tissues appear negative. MRA HEAD FINDINGS The distal left vertebral artery supplies the basilar while I suspect a diminutive distal right vertebral artery terminates in PICA. Left AICA appears dominant and is patent. No distal left vertebral artery or basilar stenosis. Normal SCA and right PCA origins. Fetal type left PCA origin. Right posterior communicating artery diminutive or absent. Normal PCA branches. Antegrade flow in both ICA siphons. No siphon stenosis. Normal ophthalmic and left posterior communicating artery origins. Patent carotid termini. Normal MCA and ACA origins. Anterior communicating artery within normal limits, median artery of the corpus callosum (normal variant). Visible ACA branches are within normal limits. MCA M1 segments, MCA bifurcations, and visible MCA branches are within normal limits. IMPRESSION: 1.  No acute  intracranial abnormality. 2. Nonspecific frontal lobe white matter signal changes are at the upper limits of normal to mildly advanced for age. 3.  Negative intracranial MRA. Electronically Signed   By: Odessa Fleming M.D.   On: 11/06/2016 05:31   Mr Laqueta Jean NO Contrast  Result Date: 11/06/2016 CLINICAL DATA:  26 y/o female with headache for the past 2 weeks. Intermittent slurred speech in blurred vision. EXAM: MRI HEAD WITHOUT AND WITH CONTRAST MRA HEAD WITHOUT CONTRAST TECHNIQUE: Multiplanar, multiecho pulse sequences of the brain and surrounding structures were obtained without and with intravenous contrast. Angiographic images of the head were obtained using MRA technique without contrast. CONTRAST:  94mL MULTIHANCE GADOBENATE DIMEGLUMINE 529 MG/ML IV SOLN COMPARISON:  Head CT without contrast 08/02/2016. FINDINGS: MRI HEAD FINDINGS Brain: Cerebral volume is within normal limits. No restricted diffusion to suggest acute infarction. No midline shift, mass effect, evidence of mass lesion, ventriculomegaly, extra-axial collection or acute intracranial hemorrhage. Cervicomedullary junction and pituitary are within normal limits. Upper limits of normal to mildly age advanced but small subcortical foci of white matter T2 and FLAIR hyperintensity in the superior frontal lobes, in a nonspecific configuration. Otherwise normal gray and white matter signal no cortical encephalomalacia. No chronic cerebral blood products. No abnormal enhancement identified. No dural thickening. Vascular: Major intracranial vascular flow voids are preserved, the distal left vertebral artery appears dominant. Major dural venous sinuses are enhancing and appear patent. Skull and upper cervical spine: Negative. Visualized bone marrow signal  is within normal limits. Sinuses/Orbits: Normal orbits soft tissues. Visualized paranasal sinuses and mastoids are stable and well pneumatized. Other: Grossly normal visible internal auditory structures. Scalp  and face soft tissues appear negative. MRA HEAD FINDINGS The distal left vertebral artery supplies the basilar while I suspect a diminutive distal right vertebral artery terminates in PICA. Left AICA appears dominant and is patent. No distal left vertebral artery or basilar stenosis. Normal SCA and right PCA origins. Fetal type left PCA origin. Right posterior communicating artery diminutive or absent. Normal PCA branches. Antegrade flow in both ICA siphons. No siphon stenosis. Normal ophthalmic and left posterior communicating artery origins. Patent carotid termini. Normal MCA and ACA origins. Anterior communicating artery within normal limits, median artery of the corpus callosum (normal variant). Visible ACA branches are within normal limits. MCA M1 segments, MCA bifurcations, and visible MCA branches are within normal limits. IMPRESSION: 1.  No acute intracranial abnormality. 2. Nonspecific frontal lobe white matter signal changes are at the upper limits of normal to mildly advanced for age. 3.  Negative intracranial MRA. Electronically Signed   By: Odessa Fleming M.D.   On: 11/06/2016 05:31    Procedures Procedures (including critical care time)  Medications Ordered in ED Medications  sodium chloride 0.9 % bolus 1,000 mL (1,000 mLs Intravenous New Bag/Given 11/06/16 0201)  prochlorperazine (COMPAZINE) injection 10 mg (10 mg Intravenous Given 11/06/16 0202)  diphenhydrAMINE (BENADRYL) injection 25 mg (25 mg Intravenous Given 11/06/16 0209)  ketorolac (TORADOL) 30 MG/ML injection 30 mg (30 mg Intravenous Given 11/06/16 0206)  gadobenate dimeglumine (MULTIHANCE) injection 20 mL (20 mLs Intravenous Contrast Given 11/06/16 0457)     Initial Impression / Assessment and Plan / ED Course  I have reviewed the triage vital signs and the nursing notes.  Pertinent labs & imaging results that were available during my care of the patient were reviewed by me and considered in my medical decision making (see chart for  details).    26 year old presenting with headache. Patient has had on and off headaches for the last 2 weeks. Prior to that she had IV been diagnosed complex migraine in late May. Patient saw neurology. She was scheduled for MRI as an outpatient in 2 weeks. MRI was not initially scheduled emergently because patient had not been having any other headaches. Patient reports that she's been having intermittent slurred speech. She reports intermittent blurred vision. Currently patient has no symptoms except headache and left-sided tingling. This is consistent with prior complex migraine.  Patient's grandmother had subarachnoid hemorrhage at age 24.   6:50 AM  The plan is to get MRI with and without. We'll touch base with neuro hospitalist here. I it's reasonable to get a MRI in emergency department giving increase in severity of symptoms. I'm also concerned about intracranial idiopathic hypertension. However given no visual symptoms, I do not think is necessary to do a lumbar puncture here in the emergency department.  6:50 AM MRI clear, headache resolved.    Will give Rx for headache medicien for patient to try at home until follow up with neurology.   Final Clinical Impressions(s) / ED Diagnoses   Final diagnoses:  None    New Prescriptions New Prescriptions   No medications on file     Abelino Derrick, MD 11/06/16 [id number]

## 2016-11-06 NOTE — ED Notes (Signed)
Patient transported to MRI 

## 2016-11-08 NOTE — Progress Notes (Signed)
Patient referred by ER for migraine, seen by me on 08/30/16, diagnostic PSG on 11/04/16.   Please call and notify the patient that the recent sleep study did not show any significant obstructive sleep apnea with the exception of mild to moderate snoring. For disturbing snoring, an oral appliance (through a qualified dentist) can be considered.   Please remind patient to try to maintain good sleep hygiene, which means: Keep a regular sleep and wake schedule and make enough time for sleep (7 1/2 to 8 1/2 hours for the average adult), try not to exercise or have a meal within 2 hours of your bedtime, try to keep your bedroom conducive for sleep, that is, cool and dark, without light distractors such as an illuminated alarm clock, and refrain from watching TV right before sleep or in the middle of the night and do not keep the TV or radio on during the night. If a nightlight is used, have it away from the visual field. Also, try not to use or play on electronic devices at bedtime, such as your cell phone, tablet PC or laptop. If you like to read at bedtime on an electronic device, try to dim the background light as much as possible. Do not eat in the middle of the night. Keep pets away from the bedroom environment. For stress relief, try meditation, deep breathing exercises (there are many books and CDs available), a white noise machine or fan can help to diffuse other noise distractors, such as traffic noise. Do not drink alcohol before bedtime, as it can disturb sleep and cause middle of the night awakenings. Never mix alcohol and sedating medications! Avoid narcotic pain medication close to bedtime, as opioids/narcotics can suppress breathing drive and breathing effort.   Please inform that we can likely FU as needed, so long as her MRI brain is also reassuring.  Once you have spoken to patient, you can close this encounter.   Thanks,  Huston Foley, MD, PhD [facility])

## 2016-11-08 NOTE — Procedures (Signed)
PATIENT'S NAME:  Heather Duke, Heather Duke DOB:      [birth year]      MR#:    [id number]     DATE OF RECORDING: 11/04/2016 REFERRING M.D.:  Beverley Fiedler MD Study Performed:   Baseline Polysomnogram HISTORY: 26 year old woman with a history of headaches, Anxiety, depression, sleep disturbance, and obesity, who reports snoring and a family history of OSA. BMI of 45.4 kg/m2. The patient's neck circumference measured 16 inches.  CURRENT MEDICATIONS: Wellbutrin, Lunesta, Compazine   PROCEDURE:  This is a multichannel digital polysomnogram utilizing the Somnostar 11.2 system.  Electrodes and sensors were applied and monitored per AASM Specifications.   EEG, EOG, Chin and Limb EMG, were sampled at 200 Hz.  ECG, Snore and Nasal Pressure, Thermal Airflow, Respiratory Effort, CPAP Flow and Pressure, Oximetry was sampled at 50 Hz. Digital video and audio were recorded.      BASELINE STUDY  Lights Out was at 22:43 and Lights On at 05:00.  Total recording time (TRT) was 377 minutes, with a total sleep time (TST) of  333 minutes.   The patient's sleep latency was 36.5 minutes, which is delayed. REM latency was 161 minutes, which is prolonged.  The sleep efficiency was 88.3 %.     SLEEP ARCHITECTURE: WASO (Wake after sleep onset) was 7.5 minutes.  There were 24 minutes in Stage N1, 157.5 minutes Stage N2, 121 minutes Stage N3 and 30.5 minutes in Stage REM.  The percentage of Stage N1 was 7.2%, Stage N2 was 47.3%, which is normal, Stage N3 was 36.3%, which is increased, and Stage R (REM sleep) was 9.2%, which is reduced. The arousals were noted as: 29 were spontaneous, 0 were associated with PLMs, 1 were associated with respiratory events.    Audio and video analysis did not show any abnormal or unusual movements, behaviors, phonations or vocalizations.  The patient took no bathroom breaks. Mild to moderate snoring was noted. The EKG was in keeping with normal sinus rhythm (NSR).  RESPIRATORY ANALYSIS:  There were a  total of 4 respiratory events:  0 obstructive apneas, 0 central apneas and 0 mixed apneas with a total of 0 apneas and an apnea index (AI) of 0 /hour. There were 4 hypopneas with a hypopnea index of .7 /hour. The patient also had 0 respiratory event related arousals (RERAs).      The total APNEA/HYPOPNEA INDEX (AHI) was .7/hour and the total RESPIRATORY DISTURBANCE INDEX was .7 /hour.  1 events occurred in REM sleep and 6 events in NREM. The REM AHI was 2. /hour, versus a non-REM AHI of .6. The patient spent 294 minutes of total sleep time in the supine position and 39 minutes in non-supine.. The supine AHI was 0.8 versus a non-supine AHI of 0.0.  OXYGEN SATURATION & C02:  The Wake baseline 02 saturation was 98%, with the lowest being 90%. Time spent below 89% saturation equaled 0 minutes.  PERIODIC LIMB MOVEMENTS: The patient had a total of 0 Periodic Limb Movements.  The Periodic Limb Movement (PLM) index was 0 and the PLM Arousal index was 0/hour.  Post-study, the patient indicated that sleep was worse than usual.   IMPRESSION:  1. Primary Snoring 2. Dysfunctions associated with sleep stages or arousal from sleep  RECOMMENDATIONS:  1. This study does not demonstrate any significant obstructive or central sleep disordered breathing with the exception of mild to moderate snoring. For disturbing snoring, an oral appliance (through a qualified dentist) can be considered.  2. This study shows sleep  fragmentation and abnormal sleep stage percentages; these are nonspecific findings and per se do not signify an intrinsic sleep disorder or a cause for the patient's sleep-related symptoms. Causes include (but are not limited to) the first night effect of the sleep study, circadian rhythm disturbances, medication effect or an underlying mood disorder or medical problem.  3. The patient should be cautioned not to drive, work at heights, or operate dangerous or heavy equipment when tired or sleepy. Review  and reiteration of good sleep hygiene measures should be pursued with any patient. 4. The patient will be notified of the results; she will be seen in follow-up by Dr. Frances Furbish at [facility] as necessary.   I certify that I have reviewed the entire raw data recording prior to the issuance of this report in accordance with the Standards of Accreditation of the American Academy of Sleep Medicine (AASM)   Huston Foley, MD, PhD Diplomat, American Board of Psychiatry and Neurology (Neurology and Sleep Medicine)

## 2016-11-09 ENCOUNTER — Telehealth: Payer: Self-pay

## 2016-11-09 NOTE — Telephone Encounter (Signed)
I called pt. I advised pt that Dr. Frances Furbish reviewed pt's sleep study and found that pt did not show any significant osa with the exception of mild to moderate snoring. Dr. Frances Furbish recommends that pt consider a dental device for disturbing snoring, made by a qualified dentist. I reviewed sleep hygiene recommendations with the pt, including trying to keep a regular sleep wake schedule, avoiding electronics in the bedroom, keeping the bedroom cool, dark, and quiet, and avoiding eating or exercising within 2 hours of bedtime as well as eating in the middle of the night. I advised pt to keep pets away from the bedtime. I discussed with pt the importance of stress relief and to try meditation, deep breathing exercises, and/or a white noise machine or fan to diffuse other noise distracters. I advised pt to not drink alcohol before bedtime and to never mix alcohol and sedating medications. Pt was advised to avoid narcotic pain medication close to bedtime. Pt verbalized understanding of results.   Pt reports that she was recently in the ER and an MRA and MRI brain were performed. Pt is asking Dr. Frances Furbish to review these results and let her know whether she needs a follow up or just prn for now.

## 2016-11-09 NOTE — Telephone Encounter (Signed)
-----   Message from Huston Foley, MD sent at 11/08/2016  7:43 AM EDT ----- Patient referred by ER for migraine, seen by me on 08/30/16, diagnostic PSG on 11/04/16.   Please call and notify the patient that the recent sleep study did not show any significant obstructive sleep apnea with the exception of mild to moderate snoring. For disturbing snoring, an oral appliance (through a qualified dentist) can be considered.   Please remind patient to try to maintain good sleep hygiene, which means: Keep a regular sleep and wake schedule and make enough time for sleep (7 1/2 to 8 1/2 hours for the average adult), try not to exercise or have a meal within 2 hours of your bedtime, try to keep your bedroom conducive for sleep, that is, cool and dark, without light distractors such as an illuminated alarm clock, and refrain from watching TV right before sleep or in the middle of the night and do not keep the TV or radio on during the night. If a nightlight is used, have it away from the visual field. Also, try not to use or play on electronic devices at bedtime, such as your cell phone, tablet PC or laptop. If you like to read at bedtime on an electronic device, try to dim the background light as much as possible. Do not eat in the middle of the night. Keep pets away from the bedroom environment. For stress relief, try meditation, deep breathing exercises (there are many books and CDs available), a white noise machine or fan can help to diffuse other noise distractors, such as traffic noise. Do not drink alcohol before bedtime, as it can disturb sleep and cause middle of the night awakenings. Never mix alcohol and sedating medications! Avoid narcotic pain medication close to bedtime, as opioids/narcotics can suppress breathing drive and breathing effort.   Please inform that we can likely FU as needed, so long as her MRI brain is also reassuring.  Once you have spoken to patient, you can close this encounter.    Thanks,  Huston Foley, MD, PhD [facility])

## 2016-11-10 NOTE — Telephone Encounter (Signed)
Pt returned my call. I advised her that Dr. Frances Furbish reviewed her MRI/MRA brain results and found no acute abnormalities and recommends a follow up prn. Pt is agreeable to this and will call us to make an appt if she needs to. Pt verbalized understanding of results. Pt had no questions at this time but was encouraged to call back if questions arise.

## 2016-11-10 NOTE — Telephone Encounter (Signed)
I reviewed MRA and MRI brain reports from 11/06/16. NO acute abn.  May FU as needed at this point.  Please update pt as requested.

## 2016-11-10 NOTE — Telephone Encounter (Signed)
I called pt to discuss. No answer, left a message asking her to call me back. 

## 2016-11-19 ENCOUNTER — Other Ambulatory Visit: Payer: BLUE CROSS/BLUE SHIELD

## 2016-12-09 ENCOUNTER — Other Ambulatory Visit: Payer: Self-pay | Admitting: Obstetrics and Gynecology

## 2016-12-09 DIAGNOSIS — N6452 Nipple discharge: Secondary | ICD-10-CM

## 2016-12-16 ENCOUNTER — Ambulatory Visit
Admission: RE | Admit: 2016-12-16 | Discharge: 2016-12-16 | Disposition: A | Discharge status: Home / Self Care | Payer: BLUE CROSS/BLUE SHIELD | Source: Ambulatory Visit | Attending: Obstetrics and Gynecology | Admitting: Obstetrics and Gynecology

## 2016-12-16 DIAGNOSIS — N6452 Nipple discharge: Secondary | ICD-10-CM

## 2016-12-21 ENCOUNTER — Other Ambulatory Visit: Payer: Self-pay | Admitting: Gastroenterology

## 2016-12-21 DIAGNOSIS — R1011 Right upper quadrant pain: Secondary | ICD-10-CM

## 2016-12-21 NOTE — Progress Notes (Signed)
Holt Woolbright MD 

## 2016-12-27 ENCOUNTER — Other Ambulatory Visit: Payer: Self-pay | Admitting: Surgery

## 2017-01-05 ENCOUNTER — Ambulatory Visit (HOSPITAL_COMMUNITY)
Admission: RE | Admit: 2017-01-05 | Discharge: 2017-01-05 | Disposition: A | Discharge status: Home / Self Care | Payer: BLUE CROSS/BLUE SHIELD | Source: Ambulatory Visit | Attending: Gastroenterology | Admitting: Gastroenterology

## 2017-01-05 ENCOUNTER — Encounter (HOSPITAL_COMMUNITY)
Admission: RE | Admit: 2017-01-05 | Discharge: 2017-01-05 | Disposition: A | Discharge status: Home / Self Care | Payer: BLUE CROSS/BLUE SHIELD | Source: Ambulatory Visit | Attending: Gastroenterology | Admitting: Gastroenterology

## 2017-01-05 DIAGNOSIS — R1011 Right upper quadrant pain: Secondary | ICD-10-CM

## 2017-01-05 MED ORDER — TECHNETIUM TC 99M MEBROFENIN IV KIT
5.5000 | PACK | Freq: Once | INTRAVENOUS | Status: AC | PRN
  Administered 2017-01-05: 5.5 via INTRAVENOUS

## 2017-10-28 DIAGNOSIS — R202 Paresthesia of skin: Secondary | ICD-10-CM | POA: Diagnosis not present

## 2017-10-28 DIAGNOSIS — R5383 Other fatigue: Secondary | ICD-10-CM | POA: Diagnosis not present

## 2017-11-22 DIAGNOSIS — G43901 Migraine, unspecified, not intractable, with status migrainosus: Secondary | ICD-10-CM | POA: Diagnosis not present

## 2017-12-02 DIAGNOSIS — M542 Cervicalgia: Secondary | ICD-10-CM | POA: Diagnosis not present

## 2017-12-02 DIAGNOSIS — G43009 Migraine without aura, not intractable, without status migrainosus: Secondary | ICD-10-CM | POA: Diagnosis not present

## 2017-12-05 DIAGNOSIS — R51 Headache: Secondary | ICD-10-CM | POA: Diagnosis not present

## 2018-01-06 DIAGNOSIS — M542 Cervicalgia: Secondary | ICD-10-CM | POA: Diagnosis not present

## 2018-01-06 DIAGNOSIS — G43009 Migraine without aura, not intractable, without status migrainosus: Secondary | ICD-10-CM | POA: Diagnosis not present

## 2018-01-06 DIAGNOSIS — Z23 Encounter for immunization: Secondary | ICD-10-CM | POA: Diagnosis not present

## 2018-01-10 ENCOUNTER — Encounter

## 2018-01-10 ENCOUNTER — Ambulatory Visit: Payer: BLUE CROSS/BLUE SHIELD | Admitting: Neurology

## 2018-01-16 DIAGNOSIS — I10 Essential (primary) hypertension: Secondary | ICD-10-CM | POA: Diagnosis not present

## 2018-02-16 DIAGNOSIS — Z01419 Encounter for gynecological examination (general) (routine) without abnormal findings: Secondary | ICD-10-CM | POA: Diagnosis not present

## 2018-02-16 DIAGNOSIS — Z6841 Body Mass Index (BMI) 40.0 and over, adult: Secondary | ICD-10-CM | POA: Diagnosis not present

## 2018-03-22 DIAGNOSIS — Z719 Counseling, unspecified: Secondary | ICD-10-CM | POA: Diagnosis not present

## 2018-04-25 DIAGNOSIS — H4710 Unspecified papilledema: Secondary | ICD-10-CM | POA: Diagnosis not present

## 2018-05-04 ENCOUNTER — Other Ambulatory Visit (HOSPITAL_COMMUNITY): Payer: Self-pay | Admitting: Neurology

## 2018-05-09 DIAGNOSIS — J0110 Acute frontal sinusitis, unspecified: Secondary | ICD-10-CM | POA: Diagnosis not present

## 2018-05-23 DIAGNOSIS — G43009 Migraine without aura, not intractable, without status migrainosus: Secondary | ICD-10-CM | POA: Diagnosis not present

## 2018-05-23 DIAGNOSIS — R202 Paresthesia of skin: Secondary | ICD-10-CM | POA: Diagnosis not present

## 2018-06-27 DIAGNOSIS — R768 Other specified abnormal immunological findings in serum: Secondary | ICD-10-CM | POA: Diagnosis not present

## 2018-06-27 DIAGNOSIS — L409 Psoriasis, unspecified: Secondary | ICD-10-CM | POA: Diagnosis not present

## 2018-06-27 DIAGNOSIS — M2550 Pain in unspecified joint: Secondary | ICD-10-CM | POA: Diagnosis not present

## 2018-07-23 DIAGNOSIS — S93491A Sprain of other ligament of right ankle, initial encounter: Secondary | ICD-10-CM | POA: Diagnosis not present

## 2020-04-14 ENCOUNTER — Other Ambulatory Visit: Payer: Self-pay | Admitting: Physical Medicine and Rehabilitation

## 2020-04-14 DIAGNOSIS — M7918 Myalgia, other site: Secondary | ICD-10-CM

## 2020-04-14 DIAGNOSIS — G8929 Other chronic pain: Secondary | ICD-10-CM

## 2020-05-02 ENCOUNTER — Encounter (HOSPITAL_COMMUNITY): Payer: Self-pay | Admitting: Emergency Medicine

## 2020-05-04 ENCOUNTER — Ambulatory Visit
Admission: RE | Admit: 2020-05-04 | Discharge: 2020-05-04 | Disposition: A | Discharge status: Home / Self Care | Payer: BC Managed Care – PPO | Source: Ambulatory Visit | Attending: Physical Medicine and Rehabilitation | Admitting: Physical Medicine and Rehabilitation

## 2020-05-04 ENCOUNTER — Other Ambulatory Visit: Payer: Self-pay

## 2020-05-04 DIAGNOSIS — M546 Pain in thoracic spine: Secondary | ICD-10-CM

## 2020-05-04 DIAGNOSIS — M7918 Myalgia, other site: Secondary | ICD-10-CM

## 2020-05-04 DIAGNOSIS — G8929 Other chronic pain: Secondary | ICD-10-CM

## 2021-07-29 IMAGING — MR MR THORACIC SPINE W/O CM
4 of 6 series · 28 of 48 positions shown · non-contrast
Comparison: None.

CLINICAL DATA: Chronic back pain with worsening over the last 6
months.

EXAM:
MRI THORACIC SPINE WITHOUT CONTRAST
TECHNIQUE: Multiplanar, multisequence MR imaging of the thoracic spine was
performed. No intravenous contrast was administered.

[Series 19: T2 · sagittal · 3.0mm · 0.94mm/px · 7 of 18 slices shown (1 of 2)]
[im 1/18]
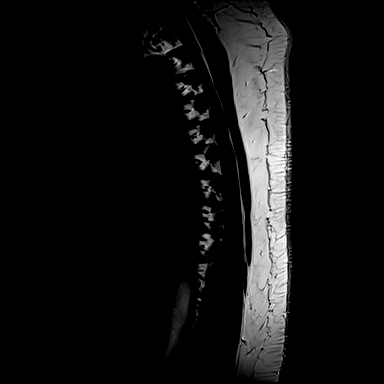
[im 3/18]
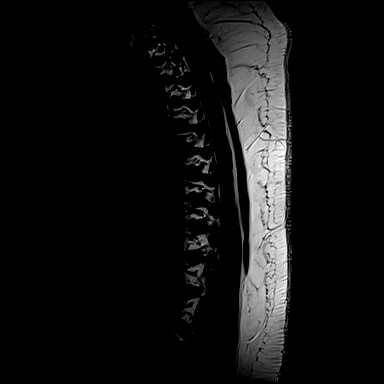
[im 6/18]
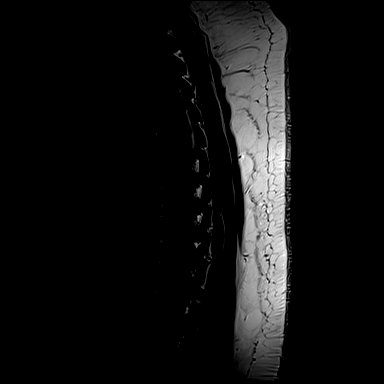
[im 9/18]
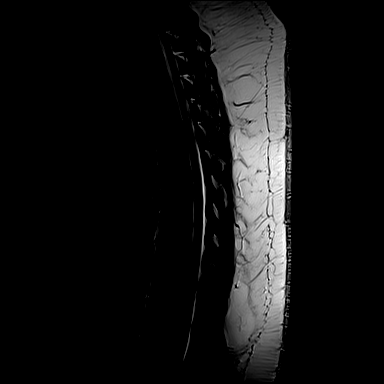
[im 12/18]
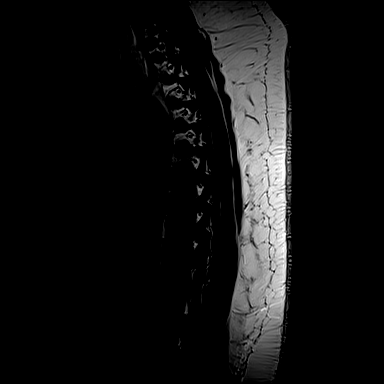
[im 15/18]
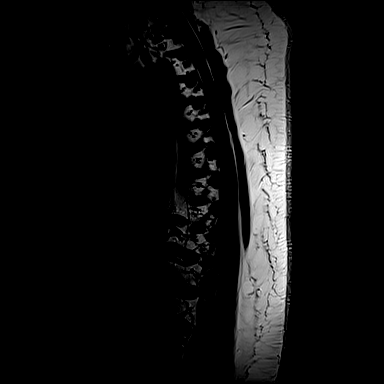
[im 18/18]
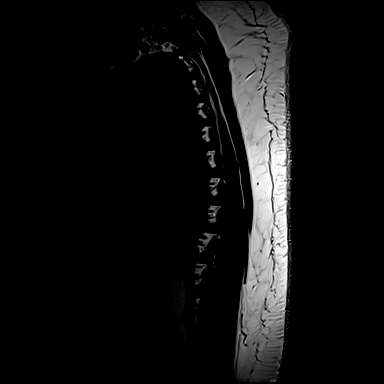

[Series 20: STIR · sagittal · 3.0mm · 0.94mm/px · 6 of 18 slices shown]
[im 1/18]
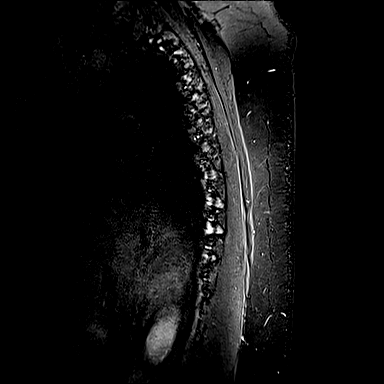
[im 4/18]
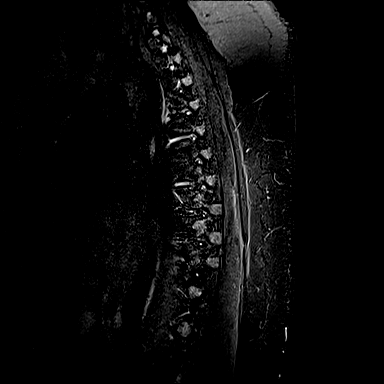
[im 7/18]
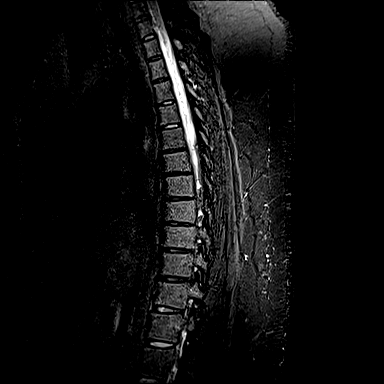
[im 11/18]
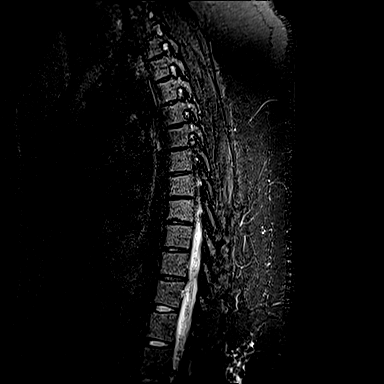
[im 14/18]
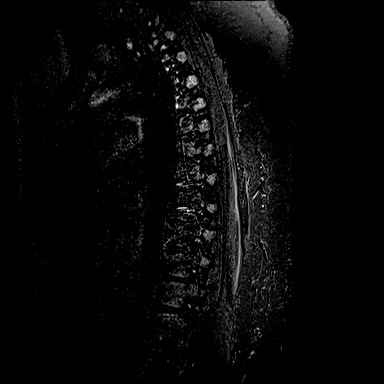
[im 18/18]
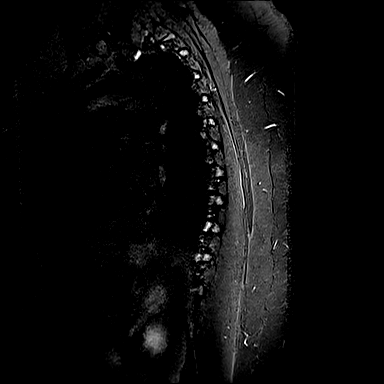

[Series 21: T1 · sagittal · 3.0mm · 1.12mm/px · 6 of 18 slices shown]
[im 1/18]
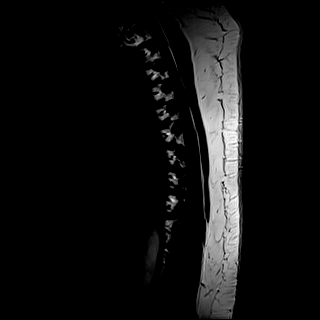
[im 4/18]
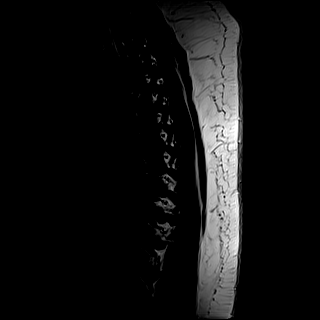
[im 7/18]
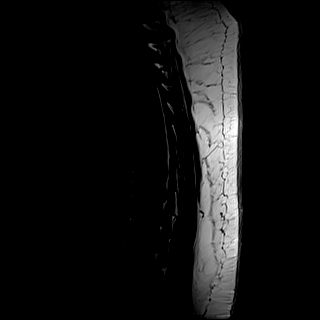
[im 11/18]
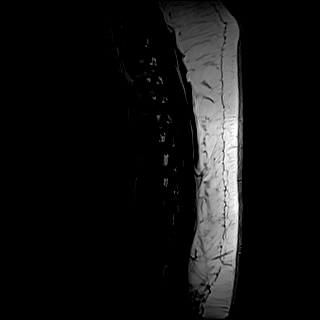
[im 14/18]
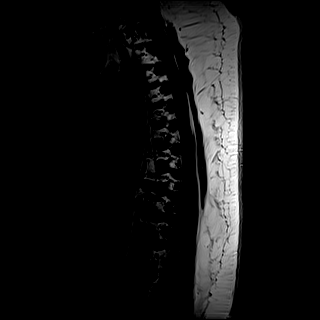
[im 18/18]
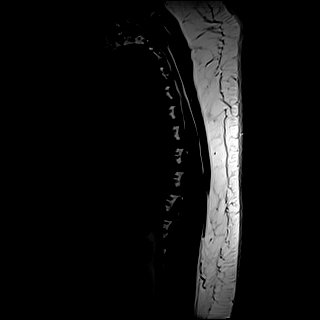

[Series 22: T2 · axial · 4.0mm · 0.35mm/px · z∈[-323,-73]mm · 9 of 36 slices shown (2 of 2)]
[im 1/36]
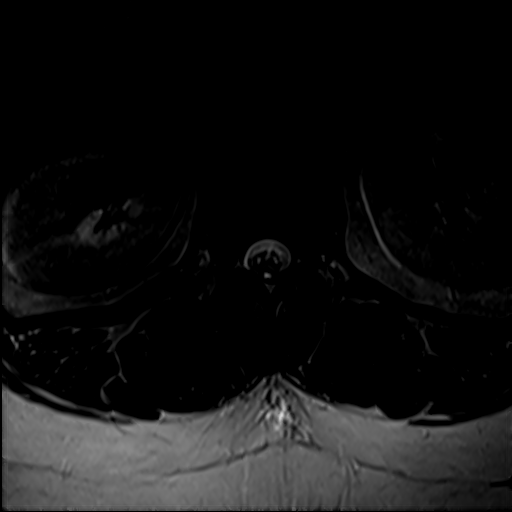
[im 7/36]
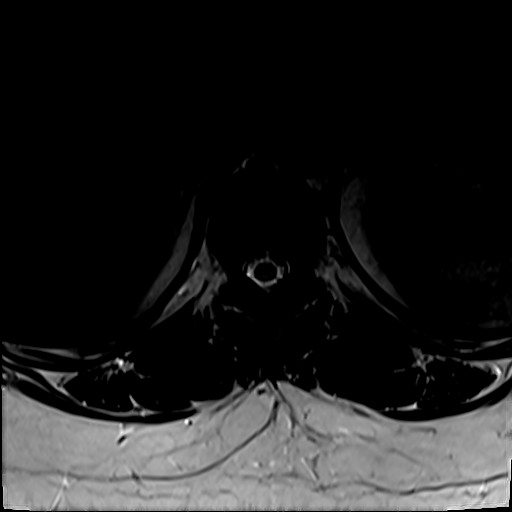
[im 10/36]
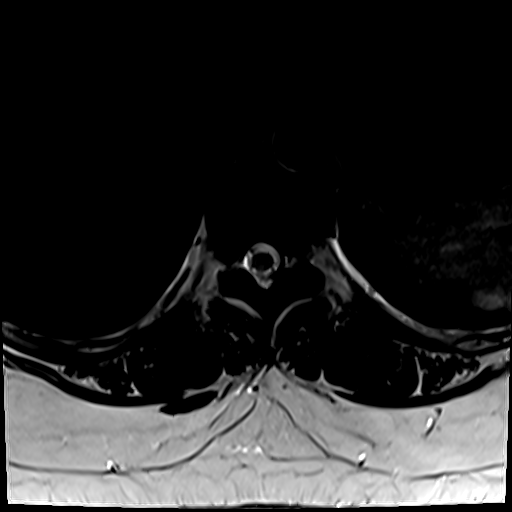
[im 16/36]
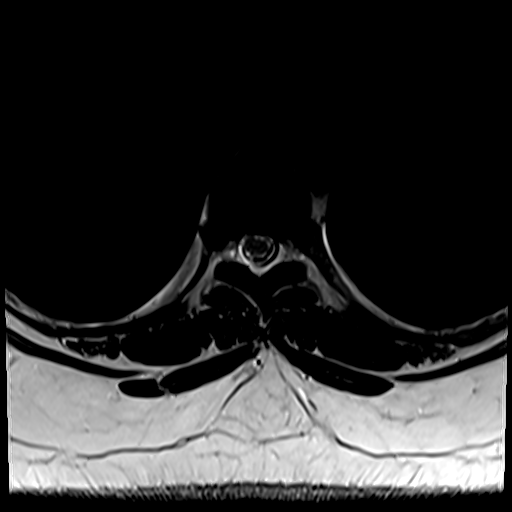
[im 20/36]
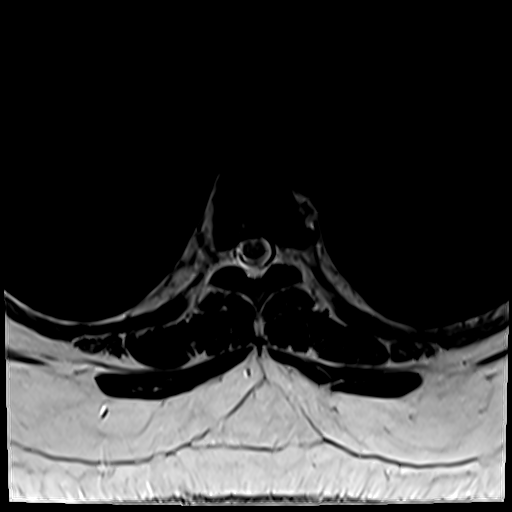
[im 26/36]
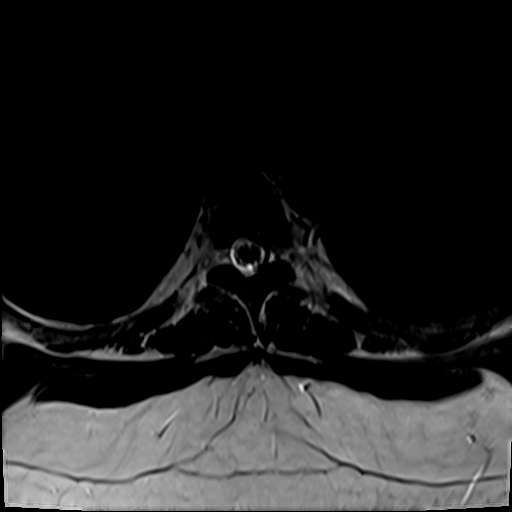
[im 29/36]
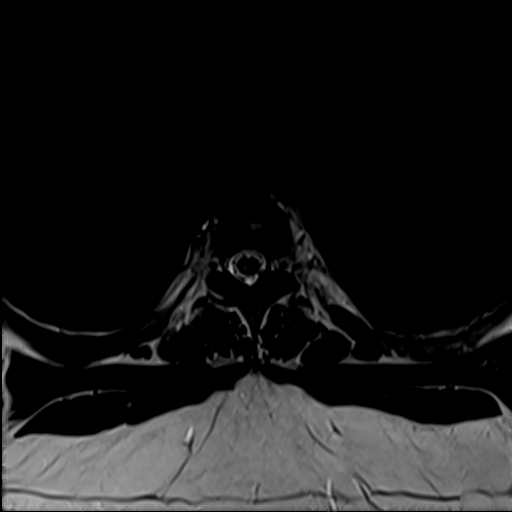
[im 32/36]
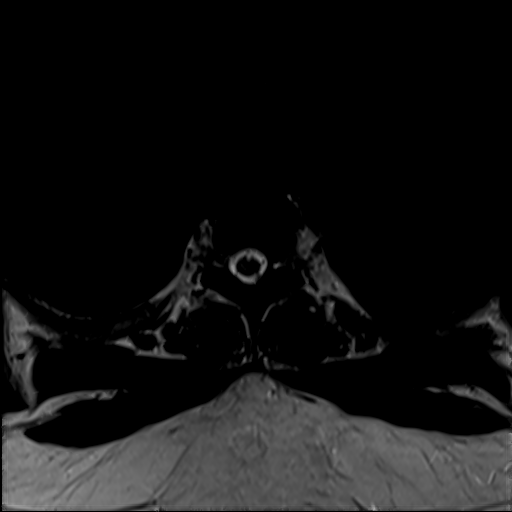
[im 36/36]
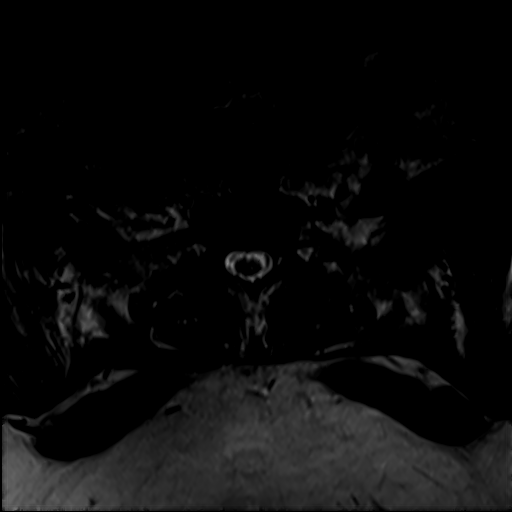

[28 of 48 positions shown; findings below may reference images not displayed]

FINDINGS: Alignment:  Normal

Vertebrae: No fracture or primary bone lesion.

Cord:  No cord compression or primary cord lesion.

Paraspinal and other soft tissues: Normal

Disc levels:

No thoracic disc abnormality from T1-2 through T4-5.

T5-6 and T6-7: Mild central disc bulging. No compressive narrowing
of the canal or foramina.

T7-8: Small left paracentral disc herniation indents the ventral
thecal sac but does not compress the cord or show foraminal
extension. This could relate to back pain.

T8-9: Shallow disc bulge.  No stenosis.

T9-10: Normal interspace.

T10-11: Shallow bilateral posterolateral disc protrusions. No
compressive narrowing of the canal. No foraminal extension. This
could relate to back pain.

T11-12 and T12-L1: Normal.

No significant facet arthropathy.
IMPRESSION: Degenerative changes in the thoracic spine which could be associated
with back pain. At T5-6, T6-7 and T8-9, there are small disc bulges
but no neural compression. At T7-8, there is a small left
paracentral disc herniation that indents the thecal sac slightly but
does not compress the cord or show foraminal extension. This could
be associated with back pain. At T10-11, there are shallow bilateral
posterolateral disc protrusions that could relate to back pain. No
neural compression is seen.

## 2022-09-13 ENCOUNTER — Other Ambulatory Visit: Payer: Self-pay | Admitting: Urology

## 2022-09-13 DIAGNOSIS — N3942 Incontinence without sensory awareness: Secondary | ICD-10-CM

## 2022-10-25 ENCOUNTER — Encounter: Payer: Self-pay | Admitting: Urology

## 2022-10-27 ENCOUNTER — Ambulatory Visit
Admission: RE | Admit: 2022-10-27 | Discharge: 2022-10-27 | Disposition: A | Discharge status: Home / Self Care | Payer: BC Managed Care – PPO | Source: Ambulatory Visit | Attending: Urology | Admitting: Urology

## 2022-10-27 DIAGNOSIS — N3942 Incontinence without sensory awareness: Secondary | ICD-10-CM

## 2022-11-10 ENCOUNTER — Other Ambulatory Visit: Payer: Self-pay | Admitting: Urology

## 2022-11-10 DIAGNOSIS — N3942 Incontinence without sensory awareness: Secondary | ICD-10-CM

## 2022-11-12 ENCOUNTER — Other Ambulatory Visit: Payer: Self-pay | Admitting: Urology

## 2022-11-12 DIAGNOSIS — N3942 Incontinence without sensory awareness: Secondary | ICD-10-CM

## 2022-11-16 ENCOUNTER — Encounter: Payer: Self-pay | Admitting: Urology

## 2022-11-24 ENCOUNTER — Encounter (HOSPITAL_COMMUNITY): Payer: Self-pay | Admitting: Emergency Medicine

## 2022-11-24 ENCOUNTER — Other Ambulatory Visit: Payer: BC Managed Care – PPO

## 2022-11-25 ENCOUNTER — Other Ambulatory Visit: Payer: Self-pay | Admitting: Urology

## 2022-11-25 DIAGNOSIS — N3942 Incontinence without sensory awareness: Secondary | ICD-10-CM

## 2022-11-26 ENCOUNTER — Other Ambulatory Visit: Payer: BC Managed Care – PPO

## 2022-11-26 ENCOUNTER — Ambulatory Visit
Admission: RE | Admit: 2022-11-26 | Discharge: 2022-11-26 | Disposition: A | Discharge status: Home / Self Care | Payer: BC Managed Care – PPO | Source: Ambulatory Visit | Attending: Urology | Admitting: Urology

## 2022-11-26 DIAGNOSIS — N3942 Incontinence without sensory awareness: Secondary | ICD-10-CM

## 2022-11-26 MED ORDER — GADOPICLENOL 0.5 MMOL/ML IV SOLN
10.0000 mL | Freq: Once | INTRAVENOUS | Status: AC | PRN
  Administered 2022-11-26: 10 mL via INTRAVENOUS

## 2023-01-27 ENCOUNTER — Encounter: Payer: Self-pay | Admitting: Physical Therapy

## 2023-01-27 ENCOUNTER — Other Ambulatory Visit: Payer: Self-pay

## 2023-01-27 ENCOUNTER — Ambulatory Visit: Payer: BC Managed Care – PPO | Attending: Urology | Admitting: Physical Therapy

## 2023-01-27 DIAGNOSIS — N398 Other specified disorders of urinary system: Secondary | ICD-10-CM | POA: Insufficient documentation

## 2023-01-27 DIAGNOSIS — M6281 Muscle weakness (generalized): Secondary | ICD-10-CM | POA: Insufficient documentation

## 2023-01-27 DIAGNOSIS — R279 Unspecified lack of coordination: Secondary | ICD-10-CM | POA: Diagnosis not present

## 2023-01-27 DIAGNOSIS — R293 Abnormal posture: Secondary | ICD-10-CM | POA: Diagnosis not present
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
## 2023-02-03 ENCOUNTER — Encounter: Payer: BC Managed Care – PPO | Admitting: Physical Therapy

## 2023-02-15 ENCOUNTER — Encounter: Payer: BC Managed Care – PPO | Admitting: Physical Therapy

## 2023-02-22 ENCOUNTER — Encounter: Payer: BC Managed Care – PPO | Admitting: Physical Therapy

## 2023-04-21 ENCOUNTER — Ambulatory Visit: Payer: BC Managed Care – PPO | Admitting: Physical Therapy

## 2023-04-28 ENCOUNTER — Encounter: Payer: BC Managed Care – PPO | Admitting: Physical Therapy

## 2023-05-05 ENCOUNTER — Encounter: Payer: BC Managed Care – PPO | Admitting: Physical Therapy

## 2023-05-12 ENCOUNTER — Encounter: Payer: BC Managed Care – PPO | Admitting: Physical Therapy

## 2023-05-19 ENCOUNTER — Encounter: Payer: BC Managed Care – PPO | Admitting: Physical Therapy

## 2023-05-26 ENCOUNTER — Ambulatory Visit: Payer: BC Managed Care – PPO | Admitting: Physical Therapy
